# Patient Record
Sex: Female | Born: 1960 | Race: White | Hispanic: No | Smoking: Never smoker
Health system: Southern US, Community
[De-identification: ages and names within clinical notes are randomized; demographics above are authoritative.]

## PROBLEM LIST (undated history)

## (undated) DIAGNOSIS — D649 Anemia, unspecified: Secondary | ICD-10-CM

## (undated) DIAGNOSIS — T7840XA Allergy, unspecified, initial encounter: Secondary | ICD-10-CM

## (undated) DIAGNOSIS — K279 Peptic ulcer, site unspecified, unspecified as acute or chronic, without hemorrhage or perforation: Secondary | ICD-10-CM

## (undated) DIAGNOSIS — K219 Gastro-esophageal reflux disease without esophagitis: Secondary | ICD-10-CM

## (undated) DIAGNOSIS — R600 Localized edema: Secondary | ICD-10-CM

## (undated) HISTORY — DX: Allergy, unspecified, initial encounter: T78.40XA

## (undated) HISTORY — PX: DILATION AND CURETTAGE OF UTERUS: SHX78

## (undated) HISTORY — PX: TUBAL LIGATION: SHX77

## (undated) HISTORY — DX: Anemia, unspecified: D64.9

## (undated) HISTORY — DX: Peptic ulcer, site unspecified, unspecified as acute or chronic, without hemorrhage or perforation: K27.9

## (undated) HISTORY — PX: COLONOSCOPY: SHX174

---

## 2002-08-27 ENCOUNTER — Ambulatory Visit (HOSPITAL_COMMUNITY): Admission: RE | Admit: 2002-08-27 | Discharge: 2002-08-27 | Payer: Self-pay | Admitting: Family Medicine

## 2003-10-21 ENCOUNTER — Other Ambulatory Visit: Admission: RE | Admit: 2003-10-21 | Discharge: 2003-10-21 | Payer: Self-pay | Admitting: Obstetrics and Gynecology

## 2004-04-20 ENCOUNTER — Other Ambulatory Visit: Admission: RE | Admit: 2004-04-20 | Discharge: 2004-04-20 | Payer: Self-pay | Admitting: Obstetrics and Gynecology

## 2005-05-19 ENCOUNTER — Other Ambulatory Visit: Admission: RE | Admit: 2005-05-19 | Discharge: 2005-05-19 | Payer: Self-pay | Admitting: Obstetrics and Gynecology

## 2010-01-20 ENCOUNTER — Encounter: Admission: RE | Admit: 2010-01-20 | Discharge: 2010-01-20 | Payer: Self-pay | Admitting: Emergency Medicine

## 2011-07-27 ENCOUNTER — Encounter: Payer: Self-pay | Admitting: Cardiology

## 2011-07-28 ENCOUNTER — Ambulatory Visit (INDEPENDENT_AMBULATORY_CARE_PROVIDER_SITE_OTHER): Payer: Federal, State, Local not specified - PPO | Admitting: Cardiology

## 2011-07-28 ENCOUNTER — Encounter: Payer: Self-pay | Admitting: Cardiology

## 2011-07-28 VITALS — BP 153/85 | HR 63 | Resp 14 | Ht 62.0 in | Wt 183.0 lb

## 2011-07-28 DIAGNOSIS — I10 Essential (primary) hypertension: Secondary | ICD-10-CM | POA: Insufficient documentation

## 2011-07-28 DIAGNOSIS — R079 Chest pain, unspecified: Secondary | ICD-10-CM

## 2011-07-28 DIAGNOSIS — E782 Mixed hyperlipidemia: Secondary | ICD-10-CM | POA: Insufficient documentation

## 2011-07-28 DIAGNOSIS — R0609 Other forms of dyspnea: Secondary | ICD-10-CM

## 2011-07-28 DIAGNOSIS — R0989 Other specified symptoms and signs involving the circulatory and respiratory systems: Secondary | ICD-10-CM

## 2011-07-28 DIAGNOSIS — E669 Obesity, unspecified: Secondary | ICD-10-CM

## 2011-07-28 MED ORDER — PRAVASTATIN SODIUM 40 MG PO TABS: 40.0000 mg | ORAL_TABLET | Freq: Every day | ORAL | Status: AC

## 2011-07-28 MED ORDER — PRAVASTATIN SODIUM 20 MG PO TABS: 20.0000 mg | ORAL_TABLET | Freq: Every day | ORAL | Status: DC

## 2011-07-28 MED ORDER — ASPIRIN 81 MG PO TBEC: 81.0000 mg | DELAYED_RELEASE_TABLET | Freq: Every day | ORAL | Status: AC

## 2011-07-28 NOTE — Patient Instructions (Addendum)
Your physician has recommended you make the following change in your medication:   START: Aspirin and pravastatin   Your physician recommends that you return for lab work with Dr. Cleta Alberts in 6-8 weeks for fasting cholesterol and liver enzyme labs.  Your physician has requested that you have en exercise stress myoview. For further information please visit [email or web address] Please follow instruction sheet, as given. We will call you with the results of your stress myoview   Your physician recommends that you schedule a follow-up appointment in: as needed with Dr. Daleen Squibb  Your physician encouraged you to lose weight for better health.  A weight loss of 2 pounds per week for a total of 30 pounds is recommended

## 2011-07-28 NOTE — Progress Notes (Signed)
HPI Anne Bates is a delightful 50 year old white female who comes in today for some chest discomfort. She is referred by Dr. Arelia Sneddon. She's been having exertional chest tightness that goes into the left neck. She also has this at rest which is fairly unpredictable. It has been going on for a year or so. It has gotten worse.  She has a history of reflux which is what has been treated. The last time she had treated and evaluated was about a year ago. He responded to Carafate.  Remarkably, she has multiple cardiac risk factors including a very strong family history, ol 160ary lifestyle, obesity, and LDL cholesterol 160, and hypertension. He was started on Vytorin by Dr  Cleta Alberts. Since that time she developed ankle discomfort and stopped it. Her discomfort got better. She is not restarted.  Recent blood work showed a fasting blood sugar of 88. Her HDL is 48 mildly low. Triglycerides were normal. TSH was normal.  She denies orthopnea, PND, edema, or palpitations. She has dyspnea on exertion and feels her heart pounding with activity.in the  EKG today is normal Past Medical History  Diagnosis Date  . Peptic ulcer     hiatal hernia  . Asthma     No past surgical history on file.  No family history on file.  History   Social History  . Marital Status: Legally Separated    Spouse Name: N/A    Number of Children: N/A  . Years of Education: N/A   Occupational History  . Not on file.   Social History Main Topics  . Smoking status: Never Smoker   . Smokeless tobacco: Not on file  . Alcohol Use: No  . Drug Use: No  . Sexually Active:    Other Topics Concern  . Not on file   Social History Narrative  . No narrative on file    Allergies  Allergen Reactions  . Vytorin     Current Outpatient Prescriptions  Medication Sig Dispense Refill  . cetirizine (ZYRTEC) 10 MG tablet Take 10 mg by mouth daily.        . Cimetidine (TAGAMET PO) Take 20 mg by mouth 2 (two) times daily.          Marland Kitchen esomeprazole (NEXIUM) 40 MG capsule Take 40 mg by mouth daily before breakfast.        . Furosemide (LASIX PO) Take 1 tablet by mouth every other day.        . ranitidine (ZANTAC) 150 MG capsule Take 150 mg by mouth 2 (two) times daily.        . sucralfate (CARAFATE) 1 G tablet Take 1 g by mouth 4 (four) times daily.        Marland Kitchen aspirin 81 MG EC tablet Take 1 tablet (81 mg total) by mouth daily.      . pravastatin (PRAVACHOL) 20 MG tablet Take 1 tablet (20 mg total) by mouth daily.  90 tablet  3    ROS Negative other than HPI.   PE General Appearance: well developed, well nourished in no acute distress, obese HEENT: symmetrical face, PERRLA, good dentition  Neck: no JVD, thyromegaly, or adenopathy, trachea midline Chest: symmetric without deformity Cardiac: PMI non-displaced, RRR, normal S1, S2, no gallop or murmur Lung: clear to ausculation and percussion Vascular: all pulses full without bruits  Abdominal: nondistended, nontender, good bowel sounds, no HSM, no bruits Extremities: no cyanosis, clubbing or edema, no sign of DVT, no varicosities  Skin: normal color, no  rashes Neuro: alert and oriented x 3, non-focal Pysch: normal affect Filed Vitals:   07/28/11 1120  BP: 153/85  Pulse: 63  Resp: 14  Height: 5\' 2"  (1.575 m)  Weight: 183 lb (83.008 kg)    EKG  Labs and Studies Reviewed.   No results found for this basename: WBC, HGB, HCT, MCV, PLT      Chemistry   No results found for this basename: NA, K, CL, CO2, BUN, CREATININE, GLU   No results found for this basename: CALCIUM, ALKPHOS, AST, ALT, BILITOT       No results found for this basename: CHOL   No results found for this basename: HDL   No results found for this basename: LDLCALC   No results found for this basename: TRIG   No results found for this basename: CHOLHDL   No results found for this basename: HGBA1C   No results found for this basename: ALT, AST, GGT, ALKPHOS, BILITOT   No results  found for this basename: TSH

## 2011-08-10 ENCOUNTER — Ambulatory Visit (HOSPITAL_COMMUNITY): Payer: Federal, State, Local not specified - PPO | Attending: Cardiology | Admitting: Radiology

## 2011-08-10 DIAGNOSIS — R0789 Other chest pain: Secondary | ICD-10-CM

## 2011-08-10 DIAGNOSIS — R079 Chest pain, unspecified: Secondary | ICD-10-CM | POA: Insufficient documentation

## 2011-08-10 DIAGNOSIS — R0989 Other specified symptoms and signs involving the circulatory and respiratory systems: Secondary | ICD-10-CM | POA: Insufficient documentation

## 2011-08-10 DIAGNOSIS — R0609 Other forms of dyspnea: Secondary | ICD-10-CM | POA: Insufficient documentation

## 2011-08-10 MED ORDER — TECHNETIUM TC 99M TETROFOSMIN IV KIT
33.0000 | PACK | Freq: Once | INTRAVENOUS | Status: AC | PRN
  Administered 2011-08-10: 33 via INTRAVENOUS

## 2011-08-10 MED ORDER — TECHNETIUM TC 99M TETROFOSMIN IV KIT
11.0000 | PACK | Freq: Once | INTRAVENOUS | Status: AC | PRN
  Administered 2011-08-10: 11 via INTRAVENOUS

## 2011-08-10 NOTE — Progress Notes (Signed)
[facility] SITE 3 NUCLEAR MED [address] [place] [address] [phone]  Cardiology Nuclear Med Study  Anne Bates is a 50 y.o. female [id number] 02-May-1961   Nuclear Med Background Indication for Stress Test:  Evaluation for Ischemia; Pending Vaginal Surgery by Dr. Richardean Chimera History:  No previous documented CAD and Asthma Cardiac Risk Factors: Family History - CAD, Hypertension, Lipids and Obesity  Symptoms:  Chest Tightness with and without Exertion (last episode of chest discomfort was last week), Diaphoresis, Dizziness, DOE, Fatigue, Near Syncope, Palpitations and Rapid HR   Nuclear Pre-Procedure Caffeine/Decaff Intake:  None NPO After: 7:00pm   Lungs:  Clear.  O2 SAT 98% on RA.  Albuterol inhaler used prior to exercise. IV 0.9% NS with Angio Cath:  20g  IV Site: R Antecubital  IV Started by:  Stanton Kidney, EMT-P  Chest Size (in):  38 Cup Size: C  Height: 5' 2.5" (1.588 m)  Weight:  180 lb (81.647 kg)  BMI:  Body mass index is 32.40 kg/(m^2). Tech Comments:  NA    Nuclear Med Study 1 or 2 day study: 1 day  Stress Test Type:  Stress  Reading MD: Cassell Clement, MD  Order Authorizing Provider:  Valera Castle, MD  Resting Radionuclide: Technetium 62m Tetrofosmin  Resting Radionuclide Dose: 11.0 mCi   Stress Radionuclide:  Technetium 16m Tetrofosmin  Stress Radionuclide Dose: 33.0 mCi           Stress Protocol Rest HR: 70 Stress HR: 171  Rest BP: 129/81 Stress BP: 163/69  Exercise Time (min): 5:01 METS: 7.0   Predicted Max HR: 171 bpm % Max HR: 100 bpm Rate Pressure Product: 21308   Dose of Adenosine (mg):  n/a Dose of Lexiscan: n/a mg  Dose of Atropine (mg): n/a Dose of Dobutamine: n/a mcg/kg/min (at max HR)  Stress Test Technologist: Smiley Houseman, CMA-N  Nuclear Technologist:  Domenic Polite, CNMT     Rest Procedure:  Myocardial perfusion imaging was performed at rest 45 minutes following the intravenous administration of  Technetium 64m Tetrofosmin.  Rest ECG: No acute changes, rare PAC.  Stress Procedure:  The patient exercised for 5:01 on the treadmill utilizing the Bruce protocol.  The patient stopped due to fatigue and denied any chest pain.  There were no diagnostic ST-T wave changes.  Technetium 31m Tetrofosmin was injected at peak exercise and myocardial perfusion imaging was performed after a brief delay.  Stress ECG: No significant change from baseline ECG  QPS Raw Data Images:  Normal; no motion artifact; normal heart/lung ratio. Stress Images:  Normal homogeneous uptake in all areas of the myocardium. Rest Images:  Normal homogeneous uptake in all areas of the myocardium. Subtraction (SDS):  No evidence of ischemia. Transient Ischemic Dilatation (Normal <1.22):  0.91 Lung/Heart Ratio (Normal <0.45):  0.31  Quantitative Gated Spect Images QGS EDV:  61 ml QGS ESV:  14 ml QGS cine images:  NL LV Function; NL Wall Motion QGS EF: 77%  Impression Exercise Capacity:  Good exercise capacity. BP Response:  Normal blood pressure response. Clinical Symptoms:  No chest pain. ECG Impression:  No significant ST segment change suggestive of ischemia. Comparison with Prior Nuclear Study: No previous nuclear study performed  Overall Impression:  Normal stress nuclear study.   Cassell Clement

## 2011-08-11 ENCOUNTER — Encounter: Payer: Self-pay | Admitting: Cardiology

## 2011-08-15 ENCOUNTER — Telehealth: Payer: Self-pay | Admitting: *Deleted

## 2011-08-15 NOTE — Telephone Encounter (Signed)
LMOVM stress test results. Mylo Red RN

## 2011-09-04 ENCOUNTER — Encounter (HOSPITAL_COMMUNITY)
Admission: RE | Admit: 2011-09-04 | Discharge: 2011-09-04 | Disposition: A | Discharge status: Home / Self Care | Payer: Federal, State, Local not specified - PPO | Source: Ambulatory Visit | Attending: Obstetrics and Gynecology | Admitting: Obstetrics and Gynecology

## 2011-09-04 ENCOUNTER — Encounter (HOSPITAL_COMMUNITY): Payer: Self-pay

## 2011-09-04 HISTORY — DX: Gastro-esophageal reflux disease without esophagitis: K21.9

## 2011-09-04 HISTORY — DX: Localized edema: R60.0

## 2011-09-04 LAB — CBC
Platelets: 228 10*3/uL (ref 150–400)
RDW: 14.4 % (ref 11.5–15.5)
WBC: 8.3 10*3/uL (ref 4.0–10.5)

## 2011-09-04 LAB — BASIC METABOLIC PANEL
Calcium: 8.8 mg/dL (ref 8.4–10.5)
Creatinine, Ser: 0.73 mg/dL (ref 0.50–1.10)
GFR calc Af Amer: >90 mL/min (ref >90)

## 2011-09-04 LAB — SURGICAL PCR SCREEN: Staphylococcus aureus: NEGATIVE

## 2011-09-04 NOTE — Pre-Procedure Instructions (Signed)
Recent stress test done- normal per pt. H/O family cardiac problems

## 2011-09-04 NOTE — Patient Instructions (Addendum)
   Your procedure is scheduled on:09/08/11  Enter through the Main Entrance of [facility] at:6am Pick up the phone at the desk and dial [phone] and inform us of your arrival.  Please call this number if you have any problems the morning of surgery: [phone]  Remember: Do not eat food after midnight:Thursday Do not drink clear liquids after:midnight Thursday Take these medicines the morning of surgery with a SIP OF WATER: GERD meds  Do not wear jewelry, make-up, or FINGER nail polish Do not wear lotions, powders, or perfumes.  You may wear deodorant. Do not shave 48 hours prior to surgery. Do not bring valuables to the hospital.  Leave suitcase in the car. After Surgery it may be brought to your room. For patients being admitted to the hospital, checkout time is 11:00am the day of discharge.  Patients discharged on the day of surgery will not be allowed to drive home.   Name and phone number of your driver: Amadeo Garnet- [phone]  Remember to use your hibiclens as instructed.Please shower with 1/2 bottle the evening before your surgery and the other 1/2 bottle the morning of surgery.

## 2011-09-05 NOTE — H&P (Signed)
  Patient name  Anne Bates DICTATION#  [id number] CSN# [id number]  Juluis Mire, MD 09/05/2011 9:09 AM

## 2011-09-06 NOTE — H&P (Signed)
NAME:  Anne Bates, Anne Bates            ACCOUNT NO.:  [id number]  MEDICAL RECORD NO.:  [id number]  LOCATION:                                 FACILITY:  PHYSICIAN:  Juluis Mire, M.D.   DATE OF BIRTH:  [birth year]  DATE OF ADMISSION: DATE OF DISCHARGE:                             HISTORY & PHYSICAL   HISTORY OF PRESENT ILLNESS:  The patient is a 50 year old, gravida 4, para 1 abortus 3 female who presents for laparoscopy with bilateral tubal ligation and laser standby.  Release of a cervical scarring. Removal of Mirena IUD.  Mid urethral sling using a transobturator approach and cystoscopy.  In relation to present admission, the patient has had a Mirena IUD in place.  She has continued to have spotting off and on.  She has also had continued problems with pain with intercourse and some bleeding after intercourse, especially during deep penetration.  On ultrasound, it looks like the IUD was slightly imbedded in the cervix and no other findings were noted.  It is of note that she had a LEEP of the cervix in 2004.  She did have moderate dysplasia at that time.  It looks like there is a little bit of scarring posteriorly that may be part of the pain with intercourse, although other aspects such as endometriosis cannot be excluded by her history.  She is also troubled with stress urinary incontinence.  She underwent urodynamic testing.  She had a normal postvoid residual.  She did not leak with the catheter in place, but once the catheter was moved she did leak with coughing.  Urethral pressure profiles were absolutely normal.  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  ALLERGIES:  She has no known drug allergies.  MEDICATIONS: 1. Zyrtec 10 mg daily. 2. Carafate 1 g 4 times a day. 3. Tagamet 20 mg 2 times  a day. 4. Nexium 40 mg daily.  PAST MEDICAL HISTORY:  She does have a history of gastroesophageal reflux disorders.  Otherwise usual childhood diseases.  PAST SURGICAL HISTORY:  No surgical procedures listed.  FAMILY HISTORY:  Family history of colon cancer, hypertension, diabetes as well as heart disease.  SOCIAL HISTORY:  No tobacco or alcohol use.  REVIEW OF SYSTEMS:  Noncontributory.  PHYSICAL EXAMINATION:  VITAL SIGNS:  The patient is afebrile with stable vital signs. HEENT:  The patient is normocephalic.  Pupils equal, round, react to light and accommodation.  Extraocular movements are intact.  Sclerae and conjunctivae clear.  Oropharynx is clear. NECK:  Without thyromegaly. BREASTS:  Not examined. LUNGS:  Clear. CARDIOVASCULAR:  Regular rhythm and rate without murmurs or gallops. ABDOMEN:  Benign.  No mass, organomegaly, or tenderness. PELVIC:  Normal external genitalia.  Vaginal mucosa is clear.  Cervix is normal but does have a slight band of scarring posteriorly.  Uterus feels to be of normal size and shape.  Adnexa unremarkable.  IUD string is noted. EXTREMITIES:  Trace edema. NEUROLOGIC:  Grossly within normal limits.  IMPRESSION: 1. Deep dyspareunia, possibly  secondary to cervical scarring versus     endometriosis. 2. Desires of sterility. 3. 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 4. History of cervical dysplasia.  PLAN:  The patient will undergo diagnostic laparoscopy with standby of bilateral tubal ligation.  Potential irreversibility of sterilization was discussed.  Failure rates of 1/200 was discussed.  It can be in the form of ectopic pregnancy requiring further surgical management.  At the same time, we will remove the IUD.  This may require some hysteroscopic evaluation, hopefully we can remove it intact.  At that point in time, we will release the posterior cervical scar.  Then, we will proceed with a mid-urethral sling  using transobturator approach and cystoscopy.  The nature of the procedures have been discussed.  The risk would include the risk of infection.  The risk of hemorrhage that could require transfusion with the risk of AIDS or hepatitis.  Risk of injury to adjacent organs including bladder, bowel, ureters that could require further exploratory surgery.  Risk of deep venous thrombosis and pulmonary emboli.  In view of the mesh, we have discussed that mesh has been associated with mesh erosion into the vagina or bladder or urethra as well as mesh rejection.  However, the FDA does point out that the suburethral mesh which is small does not have the same risks and complications as the larger sheets of mesh used to support the vaginal areas.  We have discussed the potential risk of overtightening the sling leading to an obstructive voiding pattern requiring loosening the sling with the possible return of incontinence.  We discussed the possible development of overactive bladder that could require medical suppression.  We have also discussed the potential risk of obturator nerve injury leading to chronic leg pain and weakness.  The patient understanding of all risks, indications, and alternatives.     Juluis Mire, M.D.     JSM/MEDQ  D:  09/05/2011  T:  09/05/2011  Job:  [id number]

## 2011-09-07 MED ORDER — CIPROFLOXACIN IN D5W 400 MG/200ML IV SOLN
400.0000 mg | INTRAVENOUS | Status: DC
  Filled 2011-09-07: qty 200

## 2011-09-07 MED ORDER — CLINDAMYCIN PHOSPHATE 900 MG/50ML IV SOLN
900.0000 mg | INTRAVENOUS | Status: DC
  Filled 2011-09-07: qty 50

## 2011-09-08 ENCOUNTER — Ambulatory Visit (HOSPITAL_COMMUNITY): Payer: Federal, State, Local not specified - PPO | Admitting: Anesthesiology

## 2011-09-08 ENCOUNTER — Encounter (HOSPITAL_COMMUNITY): Payer: Self-pay | Admitting: Anesthesiology

## 2011-09-08 ENCOUNTER — Encounter (HOSPITAL_COMMUNITY)
Admission: RE | Disposition: A | Discharge status: Home / Self Care | Payer: Self-pay | Source: Ambulatory Visit | Attending: Obstetrics and Gynecology

## 2011-09-08 ENCOUNTER — Ambulatory Visit (HOSPITAL_COMMUNITY)
Admission: RE | Admit: 2011-09-08 | Discharge: 2011-09-08 | Disposition: A | Discharge status: Home / Self Care | Payer: Federal, State, Local not specified - PPO | Source: Ambulatory Visit | Attending: Obstetrics and Gynecology | Admitting: Obstetrics and Gynecology

## 2011-09-08 ENCOUNTER — Encounter (HOSPITAL_COMMUNITY): Payer: Self-pay | Admitting: *Deleted

## 2011-09-08 DIAGNOSIS — Z302 Encounter for sterilization: Secondary | ICD-10-CM | POA: Insufficient documentation

## 2011-09-08 DIAGNOSIS — I10 Essential (primary) hypertension: Secondary | ICD-10-CM

## 2011-09-08 DIAGNOSIS — Z30432 Encounter for removal of intrauterine contraceptive device: Secondary | ICD-10-CM | POA: Insufficient documentation

## 2011-09-08 DIAGNOSIS — E669 Obesity, unspecified: Secondary | ICD-10-CM

## 2011-09-08 DIAGNOSIS — E782 Mixed hyperlipidemia: Secondary | ICD-10-CM

## 2011-09-08 DIAGNOSIS — Z01812 Encounter for preprocedural laboratory examination: Secondary | ICD-10-CM | POA: Insufficient documentation

## 2011-09-08 DIAGNOSIS — Z01818 Encounter for other preprocedural examination: Secondary | ICD-10-CM | POA: Insufficient documentation

## 2011-09-08 DIAGNOSIS — N393 Stress incontinence (female) (male): Secondary | ICD-10-CM | POA: Insufficient documentation

## 2011-09-08 DIAGNOSIS — IMO0002 Reserved for concepts with insufficient information to code with codable children: Secondary | ICD-10-CM | POA: Insufficient documentation

## 2011-09-08 HISTORY — PX: BLADDER SUSPENSION: SHX72

## 2011-09-08 HISTORY — PX: CYSTOSCOPY: SHX5120

## 2011-09-08 HISTORY — PX: IUD REMOVAL: SHX5392

## 2011-09-08 HISTORY — PX: LAPAROSCOPIC TUBAL LIGATION: SHX1937

## 2011-09-08 LAB — HCG, SERUM, QUALITATIVE: Preg, Serum: NEGATIVE

## 2011-09-08 SURGERY — LIGATION, FALLOPIAN TUBE, LAPAROSCOPIC
Anesthesia: General | Site: Vagina | Wound class: Clean Contaminated

## 2011-09-08 MED ORDER — SUCCINYLCHOLINE CHLORIDE 20 MG/ML IJ SOLN
INTRAMUSCULAR | Status: AC
  Filled 2011-09-08: qty 1

## 2011-09-08 MED ORDER — GLYCOPYRROLATE 0.2 MG/ML IJ SOLN
INTRAMUSCULAR | Status: DC | PRN
  Administered 2011-09-08: 0.1 mg via INTRAVENOUS

## 2011-09-08 MED ORDER — BUPIVACAINE HCL (PF) 0.25 % IJ SOLN
INTRAMUSCULAR | Status: DC | PRN
  Administered 2011-09-08: 10 mL

## 2011-09-08 MED ORDER — MIDAZOLAM HCL 5 MG/5ML IJ SOLN
INTRAMUSCULAR | Status: DC | PRN
  Administered 2011-09-08: 2 mg via INTRAVENOUS

## 2011-09-08 MED ORDER — STERILE WATER FOR IRRIGATION IR SOLN
Status: DC | PRN
  Administered 2011-09-08: 1000 mL

## 2011-09-08 MED ORDER — FENTANYL CITRATE 0.05 MG/ML IJ SOLN
INTRAMUSCULAR | Status: AC
  Filled 2011-09-08: qty 5

## 2011-09-08 MED ORDER — HYDROMORPHONE HCL 1 MG/ML IJ SOLN: 0.2500 mg | INTRAMUSCULAR | Status: DC | PRN

## 2011-09-08 MED ORDER — PANTOPRAZOLE SODIUM 40 MG PO TBEC
DELAYED_RELEASE_TABLET | ORAL | Status: AC
  Administered 2011-09-08: 40 mg via ORAL
  Filled 2011-09-08: qty 1

## 2011-09-08 MED ORDER — ONDANSETRON HCL 4 MG/2ML IJ SOLN
INTRAMUSCULAR | Status: AC
  Filled 2011-09-08: qty 2

## 2011-09-08 MED ORDER — GLYCOPYRROLATE 0.2 MG/ML IJ SOLN
INTRAMUSCULAR | Status: AC
  Filled 2011-09-08: qty 1

## 2011-09-08 MED ORDER — LIDOCAINE HCL (CARDIAC) 20 MG/ML IV SOLN
INTRAVENOUS | Status: AC
  Filled 2011-09-08: qty 5

## 2011-09-08 MED ORDER — KETOROLAC TROMETHAMINE 60 MG/2ML IM SOLN
INTRAMUSCULAR | Status: AC
  Filled 2011-09-08: qty 2

## 2011-09-08 MED ORDER — SUCCINYLCHOLINE CHLORIDE 20 MG/ML IJ SOLN
INTRAMUSCULAR | Status: DC | PRN
  Administered 2011-09-08: 100 mg via INTRAVENOUS

## 2011-09-08 MED ORDER — FENTANYL CITRATE 0.05 MG/ML IJ SOLN
INTRAMUSCULAR | Status: DC | PRN
  Administered 2011-09-08 (×2): 100 ug via INTRAVENOUS
  Administered 2011-09-08: 50 ug via INTRAVENOUS

## 2011-09-08 MED ORDER — CIPROFLOXACIN IN D5W 400 MG/200ML IV SOLN
INTRAVENOUS | Status: DC | PRN
  Administered 2011-09-08: 400 mg via INTRAVENOUS

## 2011-09-08 MED ORDER — KETOROLAC TROMETHAMINE 30 MG/ML IJ SOLN
INTRAMUSCULAR | Status: DC | PRN
  Administered 2011-09-08: 30 mg via INTRAVENOUS

## 2011-09-08 MED ORDER — KETOROLAC TROMETHAMINE 60 MG/2ML IM SOLN
INTRAMUSCULAR | Status: DC | PRN
  Administered 2011-09-08: 30 mg via INTRAMUSCULAR

## 2011-09-08 MED ORDER — PROPOFOL 10 MG/ML IV EMUL
INTRAVENOUS | Status: AC
  Filled 2011-09-08: qty 20

## 2011-09-08 MED ORDER — ONDANSETRON HCL 4 MG/2ML IJ SOLN
INTRAMUSCULAR | Status: DC | PRN
  Administered 2011-09-08: 4 mg via INTRAVENOUS

## 2011-09-08 MED ORDER — INDIGOTINDISULFONATE SODIUM 8 MG/ML IJ SOLN
INTRAMUSCULAR | Status: DC | PRN
  Administered 2011-09-08: 5 mL via INTRAVENOUS

## 2011-09-08 MED ORDER — LIDOCAINE-EPINEPHRINE (PF) 1 %-1:200000 IJ SOLN
INTRAMUSCULAR | Status: DC | PRN
  Administered 2011-09-08: 10 mL

## 2011-09-08 MED ORDER — DEXAMETHASONE SODIUM PHOSPHATE 10 MG/ML IJ SOLN
INTRAMUSCULAR | Status: DC | PRN
  Administered 2011-09-08: 10 mg via INTRAVENOUS

## 2011-09-08 MED ORDER — LACTATED RINGERS IV SOLN
INTRAVENOUS | Status: DC
  Administered 2011-09-08: 12:00:00 via INTRAVENOUS
  Administered 2011-09-08: 1000 mL via INTRAVENOUS

## 2011-09-08 MED ORDER — ROCURONIUM BROMIDE 50 MG/5ML IV SOLN
INTRAVENOUS | Status: AC
  Filled 2011-09-08: qty 1

## 2011-09-08 MED ORDER — DEXAMETHASONE SODIUM PHOSPHATE 10 MG/ML IJ SOLN
INTRAMUSCULAR | Status: AC
  Filled 2011-09-08: qty 1

## 2011-09-08 MED ORDER — MIDAZOLAM HCL 2 MG/2ML IJ SOLN
INTRAMUSCULAR | Status: AC
  Filled 2011-09-08: qty 2

## 2011-09-08 MED ORDER — LIDOCAINE HCL (CARDIAC) 20 MG/ML IV SOLN
INTRAVENOUS | Status: DC | PRN
  Administered 2011-09-08: 80 mg via INTRAVENOUS

## 2011-09-08 MED ORDER — OXYCODONE-ACETAMINOPHEN 5-500 MG PO CAPS: 1.0000 | ORAL_CAPSULE | ORAL | Status: AC | PRN

## 2011-09-08 MED ORDER — PROPOFOL 10 MG/ML IV EMUL
INTRAVENOUS | Status: DC | PRN
  Administered 2011-09-08: 150 mg via INTRAVENOUS

## 2011-09-08 MED ORDER — CLINDAMYCIN PHOSPHATE 600 MG/50ML IV SOLN
INTRAVENOUS | Status: DC | PRN
  Administered 2011-09-08: 900 mg via INTRAVENOUS

## 2011-09-08 SURGICAL SUPPLY — 31 items
ADH SKN CLS APL DERMABOND .7 (GAUZE/BANDAGES/DRESSINGS) ×2
BANDAGE GAUZE ELAST BULKY 4 IN (GAUZE/BANDAGES/DRESSINGS) IMPLANT
BLADE SURG 15 STRL LF C SS BP (BLADE) ×2 IMPLANT
BLADE SURG 15 STRL SS (BLADE) ×3
CANISTER SUCTION 2500CC (MISCELLANEOUS) ×3 IMPLANT
CATH ROBINSON RED A/P 16FR (CATHETERS) ×3 IMPLANT
CLOTH BEACON ORANGE TIMEOUT ST (SAFETY) ×3 IMPLANT
DERMABOND ADVANCED (GAUZE/BANDAGES/DRESSINGS) ×1
DERMABOND ADVANCED .7 DNX12 (GAUZE/BANDAGES/DRESSINGS) ×2 IMPLANT
DRAPE CAMERA CLOSED 9X96 (DRAPES) ×3 IMPLANT
DRAPE HYSTEROSCOPY (DRAPE) ×3 IMPLANT
DRAPE UTILITY XL STRL (DRAPES) ×3 IMPLANT
DRSG COVADERM PLUS 2X2 (GAUZE/BANDAGES/DRESSINGS) ×1 IMPLANT
GLOVE BIO SURGEON STRL SZ7 (GLOVE) ×6 IMPLANT
GOWN STRL REIN XL XLG (GOWN DISPOSABLE) ×3 IMPLANT
NDL INSUFFLATION 14GA 120MM (NEEDLE) IMPLANT
NEEDLE INSUFFLATION 14GA 120MM (NEEDLE) IMPLANT
NS IRRIG 1000ML POUR BTL (IV SOLUTION) ×3 IMPLANT
PACK LAPAROSCOPY BASIN (CUSTOM PROCEDURE TRAY) ×3 IMPLANT
PACK VAGINAL WOMENS (CUSTOM PROCEDURE TRAY) ×3 IMPLANT
SET CYSTO W/LG BORE CLAMP LF (SET/KITS/TRAYS/PACK) ×3 IMPLANT
SLING HALO OBTRYX (Sling) ×1 IMPLANT
STRIP CLOSURE SKIN 1/4X3 (GAUZE/BANDAGES/DRESSINGS) ×3 IMPLANT
SUT VIC AB 2-0 UR6 27 (SUTURE) ×6 IMPLANT
SUT VIC AB 3-0 FS2 27 (SUTURE) ×3 IMPLANT
SUT VICRYL 0 UR6 27IN ABS (SUTURE) IMPLANT
TOWEL OR 17X24 6PK STRL BLUE (TOWEL DISPOSABLE) ×6 IMPLANT
TRAY FOLEY CATH 14FR (SET/KITS/TRAYS/PACK) ×3 IMPLANT
TROCAR BALLN 12MMX100 BLUNT (TROCAR) IMPLANT
WARMER LAPAROSCOPE (MISCELLANEOUS) ×3 IMPLANT
WATER STERILE IRR 1000ML POUR (IV SOLUTION) ×3 IMPLANT

## 2011-09-08 NOTE — Anesthesia Preprocedure Evaluation (Addendum)
Anesthesia Evaluation  Patient identified by MRN, date of birth, ID band Patient awake  General Assessment Comment  Reviewed: Allergy & Precautions, H&P , Patient's Chart, lab work & pertinent test results, reviewed documented beta blocker date and time   Airway Mallampati: III TM Distance: >3 FB and <3 FB Neck ROM: full  Mouth opening: Limited Mouth Opening Comment: Patient has caps and may be a difficult intubation Dental No notable dental hx.    Pulmonary Asthma: rare inhaler use.  clear to auscultation  Pulmonary exam normal       Cardiovascular Hypertension: on Lasix, well controlled, intermittent use of meds. regular Normal    Neuro/Psych    GI/Hepatic GERD (will give protonix)   Endo/Other    Renal/GU      Musculoskeletal   Abdominal   Peds  Hematology   Anesthesia Other Findings   Reproductive/Obstetrics                         Anesthesia Physical Anesthesia Plan  ASA: II  Anesthesia Plan: General   Post-op Pain Management:    Induction: Intravenous  Airway Management Planned: Video Laryngoscope Planned  Additional Equipment:   Intra-op Plan:   Post-operative Plan:   Informed Consent: I have reviewed the patients History and Physical, chart, labs and discussed the procedure including the risks, benefits and alternatives for the proposed anesthesia with the patient or authorized representative who has indicated his/her understanding and acceptance.   Dental Advisory Given  Plan Discussed with: CRNA and Surgeon  Anesthesia Plan Comments: (  Discussed  general anesthesia, including possible nausea, instrumentation of airway, sore throat,pulmonary aspiration, etc. I asked if the were any outstanding questions, or  concerns before we proceeded. )       Anesthesia Quick Evaluation

## 2011-09-08 NOTE — Addendum Note (Signed)
Addendum  created 09/08/11 1050 by Jamieson Hetland   Modules edited:Anesthesia Responsible Staff    

## 2011-09-08 NOTE — Anesthesia Procedure Notes (Signed)
Procedure Name: Intubation Date/Time: 09/08/2011 7:33 AM Performed by: Karleen Dolphin Pre-anesthesia Checklist: Patient identified, Patient being monitored, Emergency Drugs available, Timeout performed and Suction available Patient Re-evaluated:Patient Re-evaluated prior to inductionOxygen Delivery Method: Circle System Utilized Preoxygenation: Pre-oxygenation with 100% oxygen Intubation Type: IV induction Ventilation: Mask ventilation without difficulty Laryngoscope Size: 3 Grade View: Grade I Tube type: Oral Tube size: 7.0 mm Number of attempts: 1 Airway Equipment and Method: stylet and video-laryngoscopy Placement Confirmation: ETT inserted through vocal cords under direct vision,  positive ETCO2 and breath sounds checked- equal and bilateral Secured at: 21 cm Tube secured with: Tape Dental Injury: Teeth and Oropharynx as per pre-operative assessment  Difficulty Due To: Difficulty was anticipated and Difficult Airway- due to anterior larynx

## 2011-09-08 NOTE — Addendum Note (Signed)
Addendum  created 09/08/11 1050 by Karleen Dolphin   Modules edited:Anesthesia Responsible Staff

## 2011-09-08 NOTE — Progress Notes (Signed)
Pt back up to the bathroom to void, 125cc bloody urine noted

## 2011-09-08 NOTE — Op Note (Signed)
Anne Bates, Anne Bates            ACCOUNT NO.:  [id number]  MEDICAL RECORD NO.:  [id number]  LOCATION:  [facility]                          FACILITY:  [facility]  PHYSICIAN:  Juluis Mire, M.D.   DATE OF BIRTH:  [birth year]  DATE OF PROCEDURE:  09/08/2011 DATE OF DISCHARGE:                              OPERATIVE REPORT   PREOPERATIVE DIAGNOSES: 1. Desires sterility. 2. Stress urinary incontinence. 3. Dyspareunia secondary to cervical scarring.  POSTOPERATIVE DIAGNOSES: 1. Desires sterility. 2. Stress urinary incontinence. 3. Dyspareunia secondary to cervical scarring.  OPERATIVE PROCEDURES: 1. Laparoscopic evaluation with bilateral tubal fulguration. 2. Removal of Mirena intrauterine contraceptive device. 3. Release of a posterior vaginal to cervix scar. 4. Midurethral sling using a transobturator approach. 5. Cystoscopy.  SURGEON:  Juluis Mire, MD  ANESTHESIA:  General endotracheal.  ESTIMATED BLOOD LOSS:  Minimal..  PACKS AND DRAINS:  Urethral Foley.  INTRAOPERATIVE BLOOD REPLACED:  None.  COMPLICATIONS:  None.  INDICATIONS:  Dictated in the history and physical.  PROCEDURE:  The patient was taken to the OR and placed in the supine position.  After satisfactory level of general endotracheal anesthesia was obtained, the patient was placed in dorsal lithotomy position using Allen stirrups.  The abdomen, perineum, and vagina were prepped out with Betadine.  Bladder was emptied by catheterization.  The patient then draped was then draped as sterile field.  Speculum was placed in vaginal vault.  The cervix was grasped with a single-tooth tenaculum.  Mirena IUD was removed intact.  A Hulka sac was put in place and secured.  The speculum was then removed.  A subumbilical incision was made with a knife.  The Veress needle was introduced in the abdominal cavity.  The abdomen was insufflated with approximately 3 L of carbon dioxide.  The trocar was then introduced. Laparoscope  was introduced.  Visualization revealed no evidence of injury to adjacent organs.  A 5-mm trocar was put in place the suprapubic area under direct visualization.  Visualization revealed the uterus, tubes, and ovaries to be unremarkable.  Cul-de-sac was clear. Specifically, there were no signs of adhesions or endometriosis. Appendix was visualized and noted to be normal.  Upper abdomen including liver and tip of the gallbladder were clear.  Using the Kleppinger's, a 2-cm segment of each tube was coagulated.  Coagulation was continued until resistance read 0.  We then re-coagulated the same segment.  The coagulation did extend out to the mesosalpinx.  Both tubes were adequately coagulated.  At this point in time, there were no signs of injury to adjacent organs.  The abdomen was desufflated with carbon dioxide.  All trocars were removed.  Subumbilical incision was closed with interrupted subcuticulars of 4-0 Vicryl.  Suprapubic incision was closed with Dermabond. The Hulka tenaculum then removed.  The patient's legs were repositioned. A weighted speculum was placed in the vaginal vault.  The posterior lip grasped with single-tooth tenaculum.  There was a scar or band of scar from the posterior cervix to the vaginal wall.  Using the scissors, we excised this releasing the cervix.  We then reapproximated the vaginal mucosa in a vertical fashion.  This did completely released the cervix with sutures of 2-0  Vicryl.  So, we had good release of the posterior scar at this point, no active bleeding.  Next, a Foley was placed into the urethra and clamped.  The midurethral segment was identified.  The vaginal mucosa was injected with 1% Xylocaine with epinephrine.  An incision of the vaginal mucosa was made with a knife.  We extended out laterally to the obturator foramen on each side using blunt and sharp dissection.  Segment on the peritoneum was identified at the level of the clitoris.  This was  below the abductus longus muscles and lateral to the inferior pubic ramus. Another incision was made at this area.  Using the obturator halo system, both needles were passed through the skin through the obturator foramen around the inferior pubic ramus and out to the vaginal mucosa on each side.  There were no signs that we had buttonholed the vaginal mucosa at this point.  At this point, cystoscopy was performed.  There was no evidence of injury to the bladder or urethra.  The patient had been given indigo carmine.  Blue-tinged urine was noted to be coming from both ureteral orifices.  The cystoscope was then removed.  The polypropylene mesh was hooked up to the needles and brought out through the skin incisions.  The plastic tab was cut and removed.  The mesh was then adjusted under the midurethral until it laid flat, but we could put a Kelly and rotated it to 90 degrees.  Plastic sheaths were then removed.  We reevaluated the mesh placement.  Again, it was flat and midurethral.  We trimmed the ends of the polypropylene mesh at the level of skin.  Skin was closed with Dermabond.  Vaginal mucosa was closed with a running suture of 2-0 Vicryl.  Foley is placed to straight drain.  At this point, the patient was now taken out of the dorsal position. Once alert and extubated, was transferred to recovery room in good condition.  Sponge, instrument, and needle counts were reported as correct by circulating nurse x2.     Juluis Mire, M.D.     JSM/MEDQ  D:  09/08/2011  T:  09/08/2011  Job:  [id number]

## 2011-09-08 NOTE — H&P (Signed)
History unchanged.  Physical exam same

## 2011-09-08 NOTE — Brief Op Note (Signed)
09/08/2011  8:43 AM  PATIENT:  Anne Bates  50 y.o. female  PRE-OPERATIVE DIAGNOSIS:  dysparuenia, desires sterilization, stress urinary incontinence  POST-OPERATIVE DIAGNOSIS:  dysparuenia, desires sterilization, stress urinary incontinence  PROCEDURE:  Procedure(s): LAPAROSCOPIC TUBAL LIGATION TRANSVAGINAL TAPE (TVT) PROCEDURE INTRAUTERINE DEVICE (IUD) REMOVAL CYSTOSCOPY  SURGEON:  Surgeon(s): Jaymes Revels S Jennea Rager  PHYSICIAN ASSISTANT:   ASSISTANTS: none   ANESTHESIA:   general  EBL:  Total I/O In: 300 [I.V.:300] Out: -   BLOOD ADMINISTERED:none  DRAINS: Urinary Catheter (Suprapubic)   LOCAL MEDICATIONS USED:  XYLOCAINE 10CC marcaine 5cc  SPECIMEN:  No Specimen  DISPOSITION OF SPECIMEN:  N/A  COUNTS:  YES  TOURNIQUET:  * No tourniquets in log *  DICTATION: .Other Dictation: Dictation Number [id number]  PLAN OF CARE: Discharge to home after PACU  PATIENT DISPOSITION:  PACU - hemodynamically stable.   Delay start of Pharmacological VTE agent (>24hrs) due to surgical blood loss or risk of bleeding:  no

## 2011-09-08 NOTE — Anesthesia Postprocedure Evaluation (Signed)
Anesthesia Post Note  Patient: Anne Bates  Procedure(s) Performed:  LAPAROSCOPIC TUBAL LIGATION; TRANSVAGINAL TAPE (TVT) PROCEDURE - transobturator; INTRAUTERINE DEVICE (IUD) REMOVAL - release of cervical scarring; CYSTOSCOPY  Anesthesia type: GA  Patient location: PACU  Post pain: Pain level controlled  Post assessment: Post-op Vital signs reviewed  Last Vitals:  Filed Vitals:   09/08/11 0901  BP: 131/80  Pulse: 106  Temp: 36.8 C  Resp: 20    Post vital signs: Reviewed  Level of consciousness: sedated  Complications: No apparent anesthesia complications

## 2011-09-08 NOTE — Progress Notes (Signed)
  Patient name  Kallista Pae DICTATION#  [id number] CSN# [id number]  Juluis Mire, MD 09/08/2011 8:45 AM

## 2011-09-08 NOTE — Progress Notes (Signed)
350cc of warm saline inserted through foley catheter, catheter then removed and pt up to bathroom to void

## 2011-09-08 NOTE — Progress Notes (Signed)
Pt unable to void after initial 125cc urine, bladder scan with 75cc noted on scan, Dr Arelia Sneddon, will give patient 500cc bolus and encourage fluids, discharge home and call MD if unable to void in 4 hours, instructions and hat given to pt to measure output

## 2011-09-08 NOTE — Transfer of Care (Signed)
Immediate Anesthesia Transfer of Care Note  Patient: BARBA SOLT  Procedure(s) Performed:  LAPAROSCOPIC TUBAL LIGATION; TRANSVAGINAL TAPE (TVT) PROCEDURE - transobturator; INTRAUTERINE DEVICE (IUD) REMOVAL - release of cervical scarring; CYSTOSCOPY  Patient Location: PACU  Anesthesia Type: General  Level of Consciousness: awake, alert  and oriented  Airway & Oxygen Therapy: Patient Spontanous Breathing and Patient connected to nasal cannula oxygen  Post-op Assessment: Report given to PACU RN and Post -op Vital signs reviewed and stable  Post vital signs: Reviewed and stable  Complications: No apparent anesthesia complications

## 2011-09-11 ENCOUNTER — Encounter (HOSPITAL_COMMUNITY): Payer: Self-pay | Admitting: Obstetrics and Gynecology

## 2012-03-29 ENCOUNTER — Other Ambulatory Visit: Payer: Self-pay | Admitting: Emergency Medicine

## 2012-04-30 ENCOUNTER — Other Ambulatory Visit: Payer: Self-pay | Admitting: Physician Assistant

## 2012-05-09 ENCOUNTER — Other Ambulatory Visit: Payer: Self-pay | Admitting: Emergency Medicine

## 2012-06-12 ENCOUNTER — Other Ambulatory Visit: Payer: Self-pay | Admitting: Physician Assistant

## 2012-06-12 NOTE — Telephone Encounter (Signed)
Chart pulled °

## 2012-07-01 ENCOUNTER — Ambulatory Visit (INDEPENDENT_AMBULATORY_CARE_PROVIDER_SITE_OTHER): Payer: Federal, State, Local not specified - PPO | Admitting: Family Medicine

## 2012-07-01 VITALS — BP 126/72 | HR 68 | Temp 98.0°F | Resp 16 | Ht 63.5 in | Wt 183.4 lb

## 2012-07-01 DIAGNOSIS — J029 Acute pharyngitis, unspecified: Secondary | ICD-10-CM

## 2012-07-01 LAB — POCT RAPID STREP A (OFFICE): Rapid Strep A Screen: NEGATIVE

## 2012-07-01 NOTE — Progress Notes (Signed)
[facility] [address], [place] [address] [phone]  Date:  07/01/2012   Name:  Esbeydi Manago   DOB:  03/23/1961   MRN:  [id number]  PCP:  Lucilla Edin, MD    Chief Complaint: Sore Throat, Headache and Chills   History of Present Illness:  Monna Crean is a 51 y.o. very pleasant female patient who presents with the following:  Several people at work have had strep throat recently- 5 of her coworkers at the [employer].  She has noted a mild ST for the last couple of days.  Last night she felt tired, and has noted some chills.  She has had a bit of a cough, and intermittent hoarseness.  Her job was worried and asked her to please be tested for strep throat.   She uses zyrtec for allergies.    Patient Active Problem List  Diagnosis  . Obesity  . Hypertension  . Mixed hyperlipidemia    Past Medical History  Diagnosis Date  . Peptic ulcer     hiatal hernia  . Asthma     inhaler for emergencies  . Edema leg     Lasix as needed  . GERD (gastroesophageal reflux disease)     Past Surgical History  Procedure Date  . Dilation and curettage of uterus   . Colonoscopy   . Laparoscopic tubal ligation 09/08/2011    Procedure: LAPAROSCOPIC TUBAL LIGATION;  Surgeon: Juluis Mire;  Location: [facility];  Service: Gynecology;  Laterality: Bilateral;  . Bladder suspension 09/08/2011    Procedure: TRANSVAGINAL TAPE (TVT) PROCEDURE;  Surgeon: Juluis Mire;  Location: [facility];  Service: Gynecology;  Laterality: N/A;  transobturator  . Iud removal 09/08/2011    Procedure: INTRAUTERINE DEVICE (IUD) REMOVAL;  Surgeon: Juluis Mire;  Location: [facility];  Service: Gynecology;  Laterality: N/A;  release of cervical scarring  . Cystoscopy 09/08/2011    Procedure: CYSTOSCOPY;  Surgeon: Juluis Mire;  Location: [facility];  Service: Gynecology;  Laterality: N/A;    History  Substance Use Topics  . Smoking status: Never Smoker   . Smokeless  tobacco: Not on file  . Alcohol Use: No    No family history on file.  Allergies  Allergen Reactions  . Shellfish Allergy Anaphylaxis  . Cefuroxime Axetil Hives  . Ezetimibe-Simvastatin Other (See Comments)    Pt states that tis medication caused her ankles to hurt    Medication list has been reviewed and updated.  Current Outpatient Prescriptions on File Prior to Visit  Medication Sig Dispense Refill  . aspirin 325 MG tablet Take 325 mg by mouth daily.        . cetirizine (ZYRTEC) 10 MG tablet Take 10 mg by mouth daily.        . Cimetidine (TAGAMET PO) Take 200 mg by mouth 2 (two) times daily.       . diphenhydrAMINE (BENADRYL) 25 mg capsule Take 50 mg by mouth at bedtime as needed. For sleep       . Furosemide (LASIX PO) Take 20 mg by mouth every three (3) days as needed. Pt takes only when her ankles swell.      Marland Kitchen NEXIUM 40 MG capsule TAKE ONE CAPSULE BY MOUTH EVERY DAY BEFORE  BREAKFAST  30 each  0  . pravastatin (PRAVACHOL) 40 MG tablet Take 1 tablet (40 mg total) by mouth daily.  90 tablet  3  .  DISCONTD: CARAFATE 1 GM/10ML suspension TAKE TWO TEASPOONSFUL BY MOUTH 4 TIMES DAILY ON AN EMPTY STOMACH  **RECHECK IN DECEMBER**  1260 mL  0  . aspirin 81 MG EC tablet Take 1 tablet (81 mg total) by mouth daily.      . ranitidine (ZANTAC) 150 MG capsule Take 150 mg by mouth 2 (two) times daily.        . sucralfate (CARAFATE) 1 G tablet Take 1 g by mouth 2 (two) times daily.         Review of Systems:  As per HPI- otherwise negative.   Physical Examination: Filed Vitals:   07/01/12 1120  BP: 126/72  Pulse: 68  Temp: 98 F (36.7 C)  Resp: 16   Filed Vitals:   07/01/12 1120  Height: 5' 3.5" (1.613 m)  Weight: 183 lb 6.4 oz (83.19 kg)   Body mass index is 31.98 kg/(m^2). Ideal Body Weight: Weight in (lb) to have BMI = 25: 143.1   GEN: WDWN, NAD, Non-toxic, A & O x 3 HEENT: Atraumatic, Normocephalic. Neck supple. No masses, No LAD.  TM and oropharynx wnl, PEERL,  EOMI Ears and Nose: No external deformity. CV: RRR, No M/G/R. No JVD. No thrill. No extra heart sounds. PULM: CTA B, no wheezes, crackles, rhonchi. No retractions. No resp. distress. No accessory muscle use EXTR: No c/c/e NEURO Normal gait.  PSYCH: Normally interactive. Conversant. Not depressed or anxious appearing.  Calm demeanor.   Results for orders placed in visit on 07/01/12  POCT RAPID STREP A (OFFICE)      Component Value Range   Rapid Strep A Screen Negative  Negative    Assessment and Plan: 1. Sore throat  POCT rapid strep A, Culture, Group A Strep   It does not appear that Airlie has strep throat.  Mild ST likely viral illness or allergies.  Use OTC medications as needed, await throat culture.  Did give [facility] note for today and tomorrow if needed  Abbe Amsterdam, MD

## 2012-07-03 LAB — CULTURE, GROUP A STREP: Organism ID, Bacteria: NORMAL

## 2012-09-06 ENCOUNTER — Other Ambulatory Visit: Payer: Self-pay | Admitting: Physician Assistant

## 2012-09-24 ENCOUNTER — Other Ambulatory Visit: Payer: Self-pay | Admitting: Emergency Medicine

## 2012-10-15 ENCOUNTER — Encounter: Payer: Federal, State, Local not specified - PPO | Admitting: Emergency Medicine

## 2012-10-22 ENCOUNTER — Other Ambulatory Visit: Payer: Self-pay | Admitting: Physician Assistant

## 2012-10-30 ENCOUNTER — Other Ambulatory Visit: Payer: Self-pay | Admitting: Obstetrics and Gynecology

## 2012-10-30 DIAGNOSIS — N6452 Nipple discharge: Secondary | ICD-10-CM

## 2012-11-07 ENCOUNTER — Ambulatory Visit
Admission: RE | Admit: 2012-11-07 | Discharge: 2012-11-07 | Disposition: A | Discharge status: Home / Self Care | Payer: Federal, State, Local not specified - PPO | Source: Ambulatory Visit | Attending: Obstetrics and Gynecology | Admitting: Obstetrics and Gynecology

## 2012-11-07 DIAGNOSIS — N6452 Nipple discharge: Secondary | ICD-10-CM

## 2012-12-03 ENCOUNTER — Telehealth: Payer: Self-pay

## 2012-12-03 MED ORDER — ESOMEPRAZOLE MAGNESIUM 40 MG PO CPDR: 40.0000 mg | DELAYED_RELEASE_CAPSULE | Freq: Every day | ORAL | Status: DC

## 2012-12-03 NOTE — Telephone Encounter (Signed)
Patient advised transferred to appt scheduling

## 2012-12-03 NOTE — Telephone Encounter (Signed)
PT STATES [facility] HAVE BEEN TRYING TO GET HER NEXIUM REFILLED FOR SEVERAL DAYS AND HASN'T HEARD FROM ANYONE. SHE REALLY NEED HER MEDS PLEASE CALL [phone]    [facility]

## 2012-12-03 NOTE — Telephone Encounter (Signed)
Please advise on Nexium refill. pended

## 2012-12-03 NOTE — Telephone Encounter (Signed)
Rx refilled - will need OV before out of these meds

## 2012-12-05 NOTE — Progress Notes (Signed)
Completed prior auth for pt's Nexium over the phone and received approval from 10/05/12 - 12/05/13. Faxed approval notice to pharmacy.

## 2012-12-09 ENCOUNTER — Ambulatory Visit: Payer: Federal, State, Local not specified - PPO | Admitting: Emergency Medicine

## 2013-01-14 ENCOUNTER — Ambulatory Visit (INDEPENDENT_AMBULATORY_CARE_PROVIDER_SITE_OTHER): Payer: Federal, State, Local not specified - PPO | Admitting: Emergency Medicine

## 2013-01-14 ENCOUNTER — Other Ambulatory Visit: Payer: Self-pay | Admitting: Emergency Medicine

## 2013-01-14 ENCOUNTER — Encounter: Payer: Self-pay | Admitting: Emergency Medicine

## 2013-01-14 VITALS — BP 144/94 | HR 82 | Temp 97.9°F | Resp 18 | Ht 63.0 in | Wt 181.0 lb

## 2013-01-14 DIAGNOSIS — D649 Anemia, unspecified: Secondary | ICD-10-CM | POA: Insufficient documentation

## 2013-01-14 DIAGNOSIS — K219 Gastro-esophageal reflux disease without esophagitis: Secondary | ICD-10-CM

## 2013-01-14 DIAGNOSIS — N920 Excessive and frequent menstruation with regular cycle: Secondary | ICD-10-CM | POA: Insufficient documentation

## 2013-01-14 DIAGNOSIS — R109 Unspecified abdominal pain: Secondary | ICD-10-CM

## 2013-01-14 LAB — CBC
MCHC: 31.8 g/dL (ref 30.0–36.0)
RDW: 21.3 % — ABNORMAL HIGH (ref 11.5–15.5)

## 2013-01-14 LAB — LIPASE: Lipase: 10 U/L (ref 0–75)

## 2013-01-14 LAB — COMPREHENSIVE METABOLIC PANEL
AST: 16 U/L (ref 0–37)
Albumin: 4 g/dL (ref 3.5–5.2)
Alkaline Phosphatase: 74 U/L (ref 39–117)
BUN: 8 mg/dL (ref 6–23)
Potassium: 4.3 mEq/L (ref 3.5–5.3)
Sodium: 138 mEq/L (ref 135–145)
Total Protein: 6.9 g/dL (ref 6.0–8.3)

## 2013-01-14 LAB — LIPID PANEL
Cholesterol: 184 mg/dL (ref 0–200)
LDL Cholesterol: 120 mg/dL — ABNORMAL HIGH (ref 0–99)
Triglycerides: 122 mg/dL (ref <150)

## 2013-01-14 LAB — AMYLASE: Amylase: 49 U/L (ref 0–105)

## 2013-01-14 MED ORDER — SUCRALFATE 1 GM/10ML PO SUSP: 1.0000 g | Freq: Three times a day (TID) | ORAL | Status: AC

## 2013-01-14 MED ORDER — ESOMEPRAZOLE MAGNESIUM 40 MG PO CPDR: 40.0000 mg | DELAYED_RELEASE_CAPSULE | Freq: Every day | ORAL | Status: DC

## 2013-01-14 MED ORDER — RANITIDINE HCL 150 MG PO TABS: 150.0000 mg | ORAL_TABLET | Freq: Two times a day (BID) | ORAL | Status: AC

## 2013-01-14 NOTE — Progress Notes (Signed)
  Subjective:    Patient ID: Anne Bates, female    DOB: 11/18/1960, 52 y.o.   MRN: [id number]  HPI patient has a long history of reflux. She's been on PPIs Zantac and Carafate but continues to have difficulty. She has not really been able to associate this with a particular diet. She feels like gallate may make it worse. She has recently undergone difficulty with menorrhagia. She is followed by her gynecologist for this. She had removal of a merina and since then has had very hard long periods associated with the developing anemia. Because of the anemia she has felt weak and fatigued .    Review of Systems     Objective:   Physical Exam patient is alert and cooperative she is not appear particularly pale. Neck is supple chest clear heart regular rate no murmurs. The abdomen is flat there is definite tenderness in the midepigastrium and right upper quadrant.        Assessment & Plan:  Patient here with severe issues with reflux and heartburn. Routine labs were done. We'll schedule an ultrasound of the upper abdomen followed by a GI referral to Dr. Elnoria Howard who did her colonoscopy .

## 2013-01-15 ENCOUNTER — Encounter: Payer: Self-pay | Admitting: Gastroenterology

## 2013-01-15 LAB — HELICOBACTER PYLORI  ANTIBODY, IGM: Helicobacter pylori, IgM: 1.8 U/mL (ref <9.0)

## 2013-01-16 LAB — IRON AND TIBC
TIBC: 411 ug/dL (ref 250–470)
UIBC: 286 ug/dL (ref 125–400)

## 2013-01-27 ENCOUNTER — Other Ambulatory Visit: Payer: Federal, State, Local not specified - PPO

## 2013-01-27 ENCOUNTER — Ambulatory Visit
Admission: RE | Admit: 2013-01-27 | Discharge: 2013-01-27 | Disposition: A | Discharge status: Home / Self Care | Payer: Federal, State, Local not specified - PPO | Source: Ambulatory Visit | Attending: Emergency Medicine | Admitting: Emergency Medicine

## 2013-01-27 DIAGNOSIS — R109 Unspecified abdominal pain: Secondary | ICD-10-CM

## 2013-01-31 ENCOUNTER — Ambulatory Visit: Payer: Federal, State, Local not specified - PPO | Admitting: Gastroenterology

## 2014-02-15 ENCOUNTER — Other Ambulatory Visit: Payer: Self-pay | Admitting: Emergency Medicine

## 2015-08-17 ENCOUNTER — Encounter: Payer: Self-pay | Admitting: Emergency Medicine

## 2021-12-08 ENCOUNTER — Other Ambulatory Visit: Payer: Self-pay | Admitting: Obstetrics and Gynecology

## 2021-12-08 DIAGNOSIS — R928 Other abnormal and inconclusive findings on diagnostic imaging of breast: Secondary | ICD-10-CM

## 2022-01-04 ENCOUNTER — Ambulatory Visit
Admission: RE | Admit: 2022-01-04 | Discharge: 2022-01-04 | Disposition: A | Discharge status: Home / Self Care | Payer: BC Managed Care – PPO | Source: Ambulatory Visit | Attending: Obstetrics and Gynecology | Admitting: Obstetrics and Gynecology

## 2022-01-04 DIAGNOSIS — R928 Other abnormal and inconclusive findings on diagnostic imaging of breast: Secondary | ICD-10-CM

## 2022-01-15 ENCOUNTER — Emergency Department
Admission: RE | Admit: 2022-01-15 | Discharge: 2022-01-15 | Disposition: A | Discharge status: Home / Self Care | Payer: BC Managed Care – PPO | Source: Ambulatory Visit | Attending: Family Medicine | Admitting: Family Medicine

## 2022-01-15 ENCOUNTER — Other Ambulatory Visit: Payer: Self-pay

## 2022-01-15 VITALS — BP 137/84 | HR 62 | Temp 98.8°F | Resp 16

## 2022-01-15 DIAGNOSIS — J209 Acute bronchitis, unspecified: Secondary | ICD-10-CM | POA: Diagnosis not present

## 2022-01-15 MED ORDER — AZITHROMYCIN 250 MG PO TABS: ORAL_TABLET | ORAL | 0 refills | Status: AC

## 2022-01-15 MED ORDER — BENZONATATE 200 MG PO CAPS
200.0000 mg | ORAL_CAPSULE | Freq: Two times a day (BID) | ORAL | 0 refills | Status: AC | PRN
Start: 2022-01-15 — End: ?

## 2022-01-15 NOTE — ED Notes (Signed)
Pt states tessalon perles do not work for her and asked for something else for the cough. RN to follow up with Dr Delton See and will call pt back if Dr Delton See prescribes an alternative cough medicine ?

## 2022-01-15 NOTE — ED Provider Notes (Signed)
?Christie ? ? ? ?CSN: [id number] ?Arrival date & time: 01/15/22  1219 ? ? ?  ? ?History   ?Chief Complaint ?Chief Complaint  ?Patient presents with  ? Appointment  ?  1PM  ? Cough  ? ? ?HPI ?Anne Bates is a 61 y.o. female.  ? ?HPI ? ?Patient started off with a runny nose and sore throat, it is developed into a cough.  Her chest hurts with coughing.  She feels slightly short of breath.  She had COVID in December.  She states that she does not feel she completely got over that.  Now she has cough again.  Her chest feels full.  She is coughing up some sputum.  She states that she does not have a history of regular asthma although she has had to use an inhaler for emergencies in the past.  Symptoms have been now lasted for about 10 days.  Sore throat the last 3 days ? ?Past Medical History:  ?Diagnosis Date  ? Allergy   ? Anemia   ? Asthma   ? inhaler for emergencies  ? Edema leg   ? Lasix as needed  ? GERD (gastroesophageal reflux disease)   ? Peptic ulcer   ? hiatal hernia  ? ? ?Patient Active Problem List  ? Diagnosis Date Noted  ? Anemia 01/14/2013  ? Esophageal reflux 01/14/2013  ? Menorrhagia 01/14/2013  ? Obesity 07/28/2011  ? Hypertension 07/28/2011  ? Mixed hyperlipidemia 07/28/2011  ? ? ?Past Surgical History:  ?Procedure Laterality Date  ? BLADDER SUSPENSION  09/08/2011  ? Procedure: TRANSVAGINAL TAPE (TVT) PROCEDURE;  Surgeon: Darlyn Chamber;  Location: [facility];  Service: Gynecology;  Laterality: N/A;  transobturator  ? COLONOSCOPY    ? CYSTOSCOPY  09/08/2011  ? Procedure: CYSTOSCOPY;  Surgeon: Darlyn Chamber;  Location: [facility];  Service: Gynecology;  Laterality: N/A;  ? DILATION AND CURETTAGE OF UTERUS    ? IUD REMOVAL  09/08/2011  ? Procedure: INTRAUTERINE DEVICE (IUD) REMOVAL;  Surgeon: Darlyn Chamber;  Location: [facility];  Service: Gynecology;  Laterality: N/A;  release of cervical scarring  ? LAPAROSCOPIC TUBAL LIGATION  09/08/2011  ? Procedure: LAPAROSCOPIC TUBAL LIGATION;  Surgeon: Darlyn Chamber;  Location: [facility];  Service: Gynecology;  Laterality: Bilateral;  ? TUBAL LIGATION    ? ? ?OB History   ?No obstetric history on file. ?  ? ? ? ?Home Medications   ? ?Prior to Admission medications   ?Medication Sig Start Date End Date Taking? Authorizing Provider  ?aspirin 325 MG tablet Take 325 mg by mouth daily.     Yes [provider]  ?atorvastatin (LIPITOR) 80 MG tablet atorvastatin 80 mg tablet ? TAKE 1 TABLET BY MOUTH EVERY DAY 09/13/17  Yes [provider]  ?azithromycin (ZITHROMAX Z-PAK) 250 MG tablet Take 2 pills today.  Starting tomorrow take 1 a day until gone 01/15/22  Yes Raylene Everts, MD  ?benzonatate (TESSALON) 200 MG capsule Take 1 capsule (200 mg total) by mouth 2 (two) times daily as needed for cough. 01/15/22  Yes Raylene Everts, MD  ?cetirizine (ZYRTEC) 10 MG tablet Take 10 mg by mouth daily.     Yes [provider]  ?clopidogrel (PLAVIX) 75 MG tablet clopidogrel 75 mg tablet 11/20/18  Yes [provider]  ?Furosemide (LASIX PO) Take 20 mg by mouth every three (3) days as needed. Pt takes only when her ankles swell.   Yes [provider]  ?  levothyroxine (SYNTHROID) 50 MCG tablet Take 50 mcg by mouth daily. 12/27/21  Yes [provider]  ?pantoprazole (PROTONIX) 40 MG tablet pantoprazole 40 mg tablet,delayed release ? TAKE ONE TABLET BY MOUTH DAILY. 07/27/21  Yes [provider]  ?ranolazine (RANEXA) 500 MG 12 hr tablet Take 500 mg by mouth 2 (two) times daily. 11/29/21  Yes [provider]  ?Cimetidine (TAGAMET PO) Take 200 mg by mouth 2 (two) times daily.     [provider]  ?diphenhydrAMINE (BENADRYL) 25 mg capsule Take 50 mg by mouth at bedtime as needed. For sleep     [provider]  ?esomeprazole (NEXIUM) 40 MG capsule TAKE ONE CAPSULE BY MOUTH EVERY DAY BEFORE BREAKFAST 02/15/14   Gale Journey, Damaris Hippo, PA-C  ?Ferrous Sulfate (IRON SUPPLEMENT PO) Take by mouth daily.    [provider]   ?pravastatin (PRAVACHOL) 40 MG tablet Take 1 tablet (40 mg total) by mouth daily. 07/28/11 07/27/12  Wall, Marijo Conception, MD  ?ranitidine (ZANTAC) 150 MG tablet Take 1 tablet (150 mg total) by mouth 2 (two) times daily. 01/14/13   Darlyne Russian, MD  ?sucralfate (CARAFATE) 1 GM/10ML suspension Take 10 mLs (1 g total) by mouth 4 (four) times daily -  with meals and at bedtime. 01/14/13   Darlyne Russian, MD  ? ? ?Family History ?Family History  ?Problem Relation Age of Onset  ? Cancer Mother   ?     multiple myeloma  ? Heart disease Father   ? Cancer Brother   ?     colon  ? ? ?Social History ?Social History  ? ?Tobacco Use  ? Smoking status: Never  ?Substance Use Topics  ? Alcohol use: No  ? Drug use: No  ? ? ? ?Allergies   ?Peanut oil, Peanut-containing drug products, Shellfish allergy, Cefuroxime axetil, and Ezetimibe-simvastatin ? ? ?Review of Systems ?Review of Systems ? ?See HPI ?Physical Exam ?Triage Vital Signs ?ED Triage Vitals  ?Enc Vitals Group  ?   BP 01/15/22 1300 137/84  ?   Pulse Rate 01/15/22 1300 62  ?   Resp 01/15/22 1300 16  ?   Temp 01/15/22 1300 98.8 ?F (37.1 ?C)  ?   Temp Source 01/15/22 1300 Oral  ?   SpO2 01/15/22 1300 98 %  ?   Weight --   ?   Height --   ?   Head Circumference --   ?   Peak Flow --   ?   Pain Score 01/15/22 1255 0  ?   Pain Loc --   ?   Pain Edu? --   ?   Excl. in [place]? --   ? ?No data found. ? ?Updated Vital Signs ?BP 137/84 (BP Location: Right Arm)   Pulse 62   Temp 98.8 ?F (37.1 ?C) (Oral)   Resp 16   LMP 09/03/2010   SpO2 98%  ?   ? ?Physical Exam ?Constitutional:   ?   General: She is not in acute distress. ?   Appearance: She is well-developed. She is ill-appearing.  ?HENT:  ?   Head: Normocephalic and atraumatic.  ?   Right Ear: Tympanic membrane and ear canal normal.  ?   Left Ear: Tympanic membrane and ear canal normal.  ?   Nose: Congestion present.  ?   Mouth/Throat:  ?   Pharynx: Posterior oropharyngeal erythema present.  ?Eyes:  ?   Conjunctiva/sclera: Conjunctivae  normal.  ?   Pupils: Pupils are  equal, round, and reactive to light.  ?Cardiovascular:  ?   Rate and Rhythm: Normal rate and regular rhythm.  ?   Heart sounds: Normal heart sounds.  ?Pulmonary:  ?   Effort: Pulmonary effort is normal. No respiratory distress.  ?   Breath sounds: Rhonchi present.  ?Abdominal:  ?   General: There is no distension.  ?   Palpations: Abdomen is soft.  ?Musculoskeletal:     ?   General: Normal range of motion.  ?   Cervical back: Normal range of motion.  ?Lymphadenopathy:  ?   Cervical: No cervical adenopathy.  ?Skin: ?   General: Skin is warm and dry.  ?Neurological:  ?   Mental Status: She is alert.  ?Psychiatric:     ?   Mood and Affect: Mood normal.     ?   Behavior: Behavior normal.  ? ? ? ?UC Treatments / Results  ?Labs ?(all labs ordered are listed, but only abnormal results are displayed) ?Labs Reviewed - No data to display ? ?EKG ? ? ?Radiology ?No results found. ? ?Procedures ?Procedures (including critical care time) ? ?Medications Ordered in UC ?Medications - No data to display ? ?Initial Impression / Assessment and Plan / UC Course  ?I have reviewed the triage vital signs and the nursing notes. ? ?Pertinent labs & imaging results that were available during my care of the patient were reviewed by me and considered in my medical decision making (see chart for details). ? ?  ? ? ?Final Clinical Impressions(s) / UC Diagnoses  ? ?Final diagnoses:  ?Acute bronchitis, unspecified organism  ? ? ? ?Discharge Instructions   ? ?  ?Drink lots of fluids ?Run a humidifier if you have 1 ?Take the Z-Pak as directed.  2 pills today then 1 a day until gone ?Take Tessalon 2 times a day for cough ?See your doctor if not improving by next week ? ? ? ? ?ED Prescriptions   ? ? Medication Sig Dispense Auth. Provider  ? benzonatate (TESSALON) 200 MG capsule Take 1 capsule (200 mg total) by mouth 2 (two) times daily as needed for cough. 20 capsule Raylene Everts, MD  ? azithromycin (ZITHROMAX  Z-PAK) 250 MG tablet Take 2 pills today.  Starting tomorrow take 1 a day until gone 6 tablet Raylene Everts, MD  ? ?  ? ?PDMP not reviewed this encounter. ?  ?Raylene Everts, MD ?01/15/22 1322 ? ?

## 2022-01-15 NOTE — Discharge Instructions (Addendum)
Drink lots of fluids ?Run a humidifier if you have 1 ?Take the Z-Pak as directed.  2 pills today then 1 a day until gone ?Take Tessalon 2 times a day for cough ?See your doctor if not improving by next week ?

## 2022-01-15 NOTE — ED Triage Notes (Signed)
Patient presents to Urgent Care with complaints of cough, headache, sore throat since 3 days ago. Patient reports cough syrup last night. Drinking hot tea. Home covid test was negative. Had Covid in December. UTD on covid vaccines and flu shot.    ?

## 2023-04-03 IMAGING — MG MM DIGITAL DIAGNOSTIC UNILAT*R* W/ TOMO W/ CAD
4 series · 4 of 12 positions shown · non-contrast
Comparison: Previous exam(s).

CLINICAL DATA: 60-year-old female for further evaluation of
possible RIGHT breast asymmetry on screening mammogram.

EXAM:
DIGITAL DIAGNOSTIC UNILATERAL RIGHT MAMMOGRAM WITH TOMOSYNTHESIS AND
CAD; ULTRASOUND RIGHT BREAST LIMITED
TECHNIQUE: Right digital diagnostic mammography and breast tomosynthesis was
performed. The images were evaluated with computer-aided detection.;
Targeted ultrasound examination of the right breast was performed

[R MLO synth-2D]
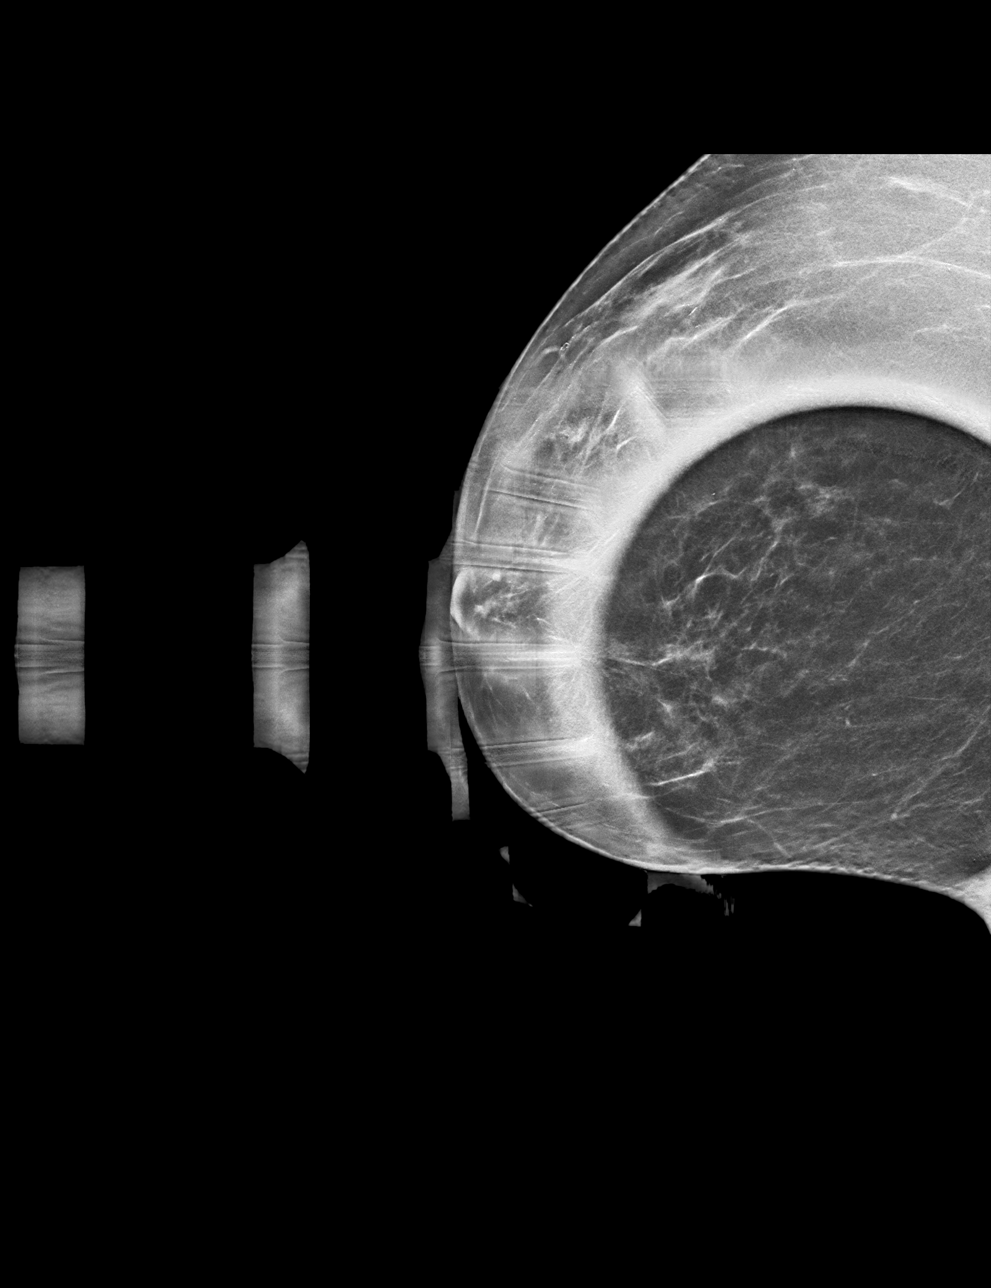

[R ML synth-2D]
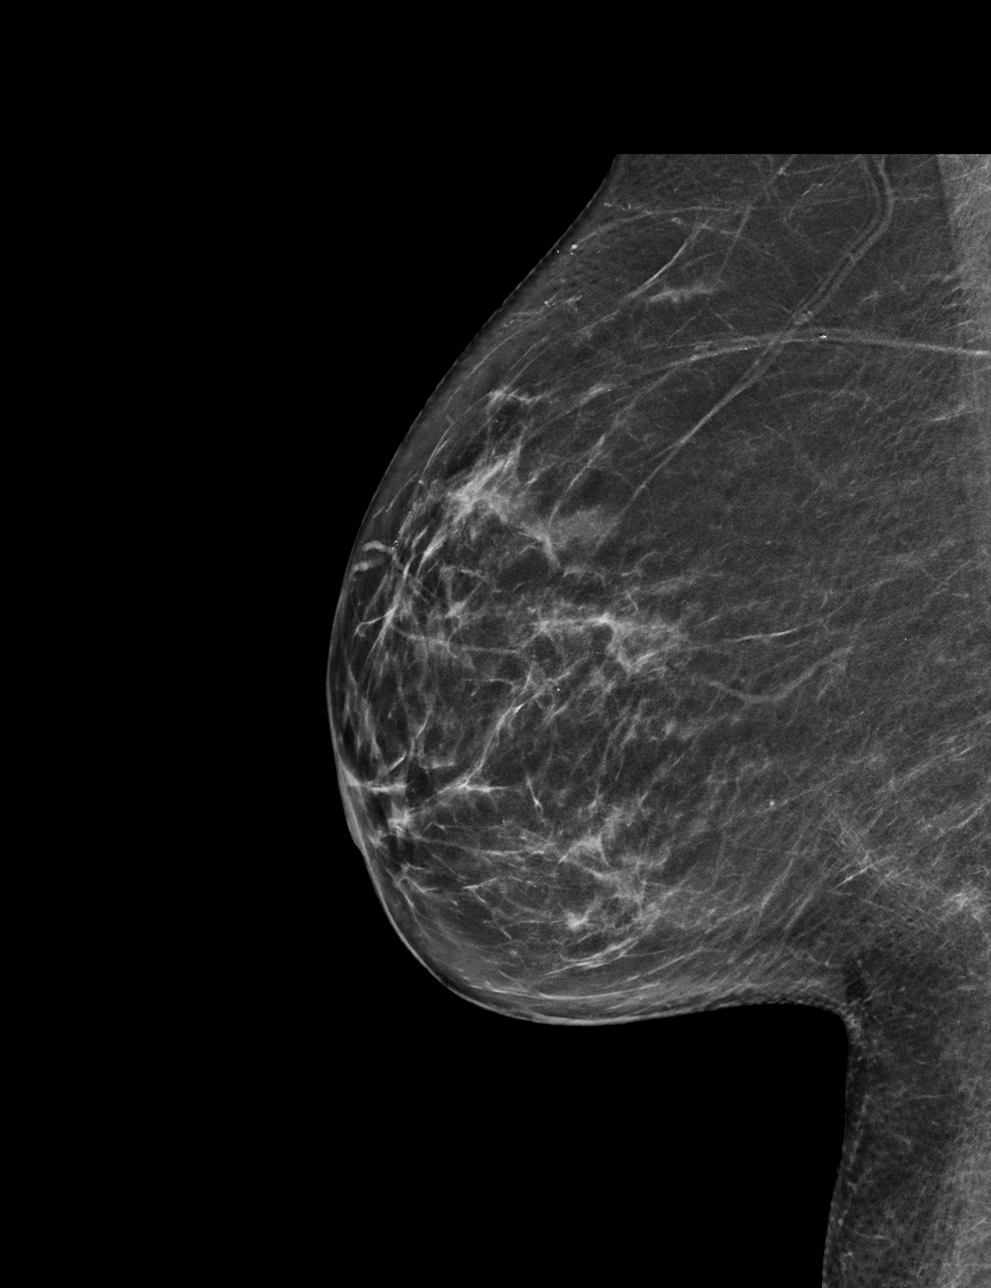

[R MLO tomo · tomo slice 27/53.0]
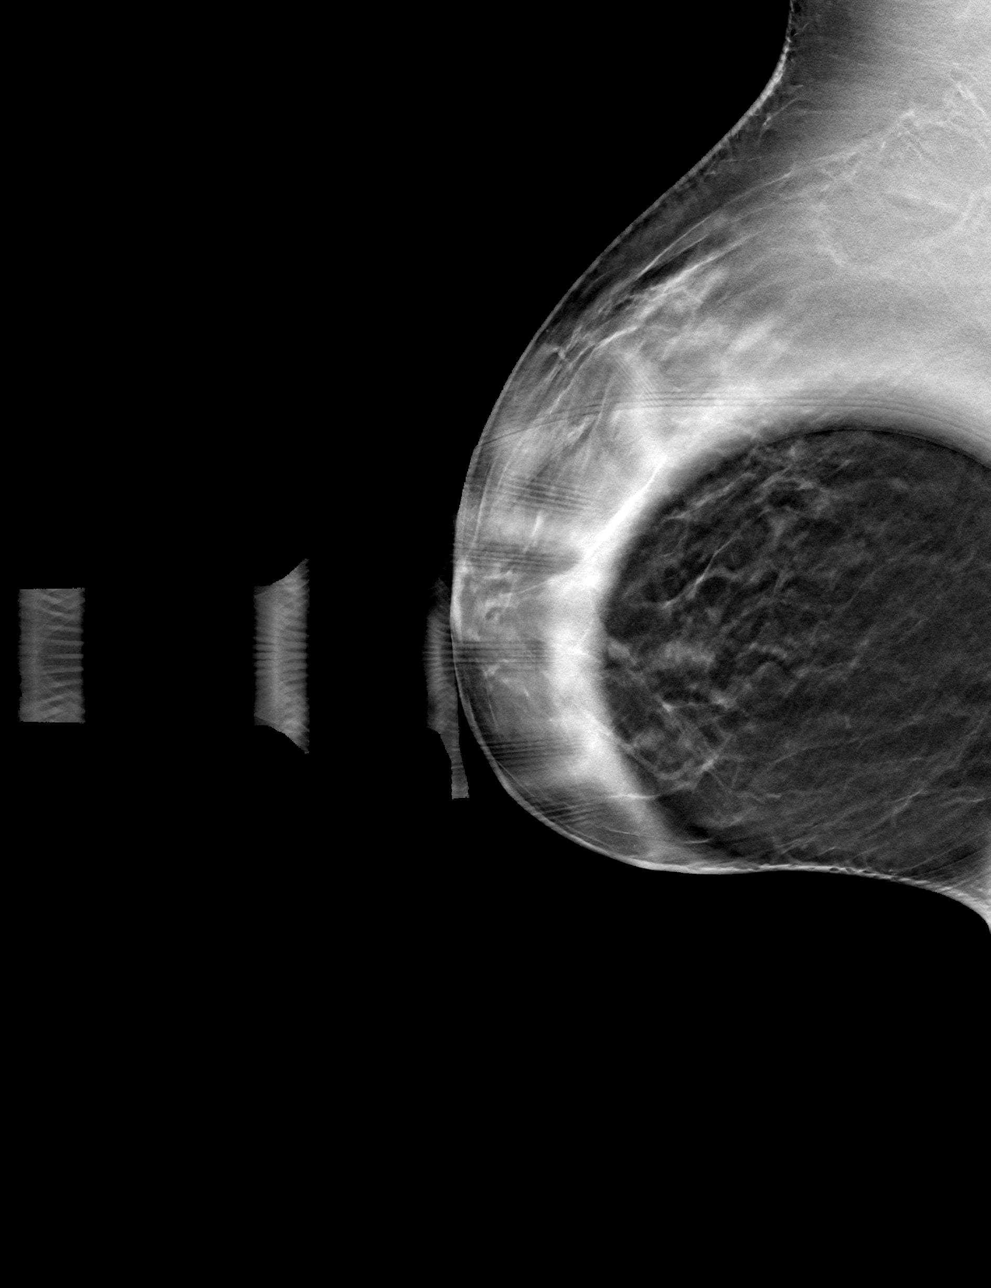

[R ML tomo · tomo slice 31/62.0]
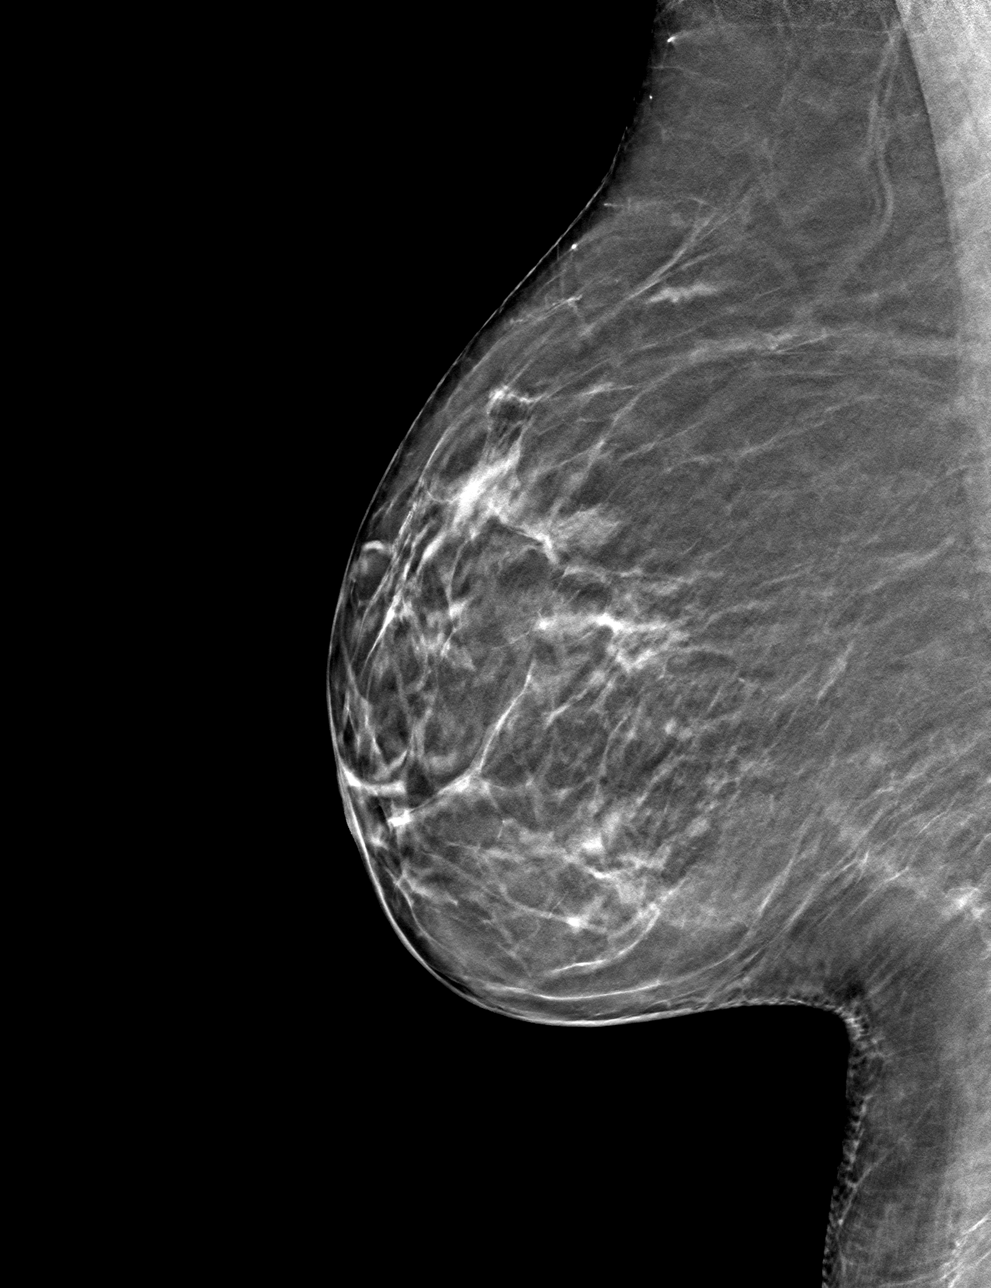

[4 of 12 positions shown; findings below may reference images not displayed]

ACR Breast Density Category b: There are scattered areas of
fibroglandular density.
FINDINGS: 2D/3D full field and spot compression views of the RIGHT breast
demonstrate a 0.4 cm persistent low-density circumscribed oval mass
in the central RIGHT breast.

Targeted ultrasound is performed, showing a 0.4 x 0.2 x 0.3 cm
benign cyst at the 9 o'clock position of the RIGHT breast 1 cm from
the nipple, a good correlate to the screening study finding. No
other sonographic abnormalities are noted in the area of the
screening study finding.
IMPRESSION: Benign cyst in the central RIGHT breast, a good correlate to the
screening study finding.

RECOMMENDATION:
Bilateral screening mammogram in 1 year.

I have discussed the findings and recommendations with the patient.
If applicable, a reminder letter will be sent to the patient
regarding the next appointment.

BI-RADS CATEGORY  2: Benign.
